# Patient Record
Sex: Female | Born: 1980 | Hispanic: No | Marital: Married
Health system: Southern US, Community
[De-identification: ages and names within clinical notes are randomized; demographics above are authoritative.]

---

## 2015-03-19 ENCOUNTER — Other Ambulatory Visit (HOSPITAL_COMMUNITY): Payer: Self-pay | Admitting: Obstetrics and Gynecology

## 2015-03-19 DIAGNOSIS — Z3A20 20 weeks gestation of pregnancy: Secondary | ICD-10-CM

## 2015-03-19 DIAGNOSIS — O283 Abnormal ultrasonic finding on antenatal screening of mother: Secondary | ICD-10-CM

## 2015-03-20 ENCOUNTER — Ambulatory Visit (HOSPITAL_COMMUNITY)
Admission: RE | Admit: 2015-03-20 | Discharge: 2015-03-20 | Disposition: A | Discharge status: Home / Self Care | Payer: Medicaid Other | Source: Ambulatory Visit

## 2015-03-20 ENCOUNTER — Encounter (HOSPITAL_COMMUNITY): Payer: Self-pay

## 2015-03-20 ENCOUNTER — Ambulatory Visit (HOSPITAL_COMMUNITY)
Admission: RE | Admit: 2015-03-20 | Discharge: 2015-03-20 | Disposition: A | Discharge status: Home / Self Care | Payer: Medicaid Other | Source: Ambulatory Visit | Attending: Obstetrics and Gynecology | Admitting: Obstetrics and Gynecology

## 2015-03-20 DIAGNOSIS — O283 Abnormal ultrasonic finding on antenatal screening of mother: Secondary | ICD-10-CM | POA: Diagnosis not present

## 2015-03-20 DIAGNOSIS — Z3A20 20 weeks gestation of pregnancy: Secondary | ICD-10-CM | POA: Diagnosis not present

## 2015-03-20 DIAGNOSIS — Z315 Encounter for genetic counseling: Secondary | ICD-10-CM | POA: Insufficient documentation

## 2015-03-23 DIAGNOSIS — O283 Abnormal ultrasonic finding on antenatal screening of mother: Secondary | ICD-10-CM | POA: Insufficient documentation

## 2015-03-23 NOTE — Progress Notes (Signed)
Genetic Counseling  High-Risk Gestation Note  Appointment Date:  03/20/2015 Referred By: Sharlynn Oliphant, DO Date of Birth:  [birth year]   Pregnancy History: [id number] Estimated Date of Delivery: 08/06/15 Estimated Gestational Age: 36w1dAttending: PBenjaman Lobe MD    I met with Ms. Kelsey Fieldingfor genetic counseling because of abnormal ultrasound findings.  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, choroid plexus cysts, single umbilical artery  Discussed increased risk for single underlying etiology such as chromosome condition or single gene condition  Patient declined NIPS and amniocentesis today, stating she needed to further discuss testing options with husband  Patient indicated she would possibly be interested in termination of pregnancy for physical abnormalities  Patient understands the legal limit for TOP in [place] is [redacted] weeks gestation but also aware of out of state options  Patient also understands that prognosis cannot be accurately determined without identifying a specific etiology and that the etiology would not likely be able to be determined solely by prenatal ultrasound  We began by reviewing the ultrasound in detail. Ultrasound performed today visualized the following: Bilateral choroid plexus cysts, Absent right radius; the right hand appears to be acutely angulated - a right thumb was not visualized, abnormal fetal heart (likely AV canal), single umbilical artery, and limited views of fetal face. Complete ultrasound results reported separately.   We discussed that each of the ultrasound findings could be isolated, separate occurrences. However, the constellation of the findings on ultrasound are more suggestive of a single underlying etiology.   Congenital heart defects occur in approximately 1% of pregnancies. Congenital heart defects may occur due to  multifactorial influences, chromosomal abnormalities, genetic syndromes or environmental exposures.  Atrioventricular septal defect (also referred to as AV canal defect or endocardial cushion defect) describes a central defect of the heart involving the atrial septum, ventricular septum, and atrioventricular valves. An AV canal defect is associated with an increased risk for fetal aneuploidy of approximately 50-60%. AV canal defect is associated with a significant increased risk for Trisomy 21, which is present in approximately 40% of cases of AV canal defect.   Radial ray malformation encompasses a spectrum of anomalies that can include aplasia or hypoplasia of the radius, radial carpal bones, and/or thumb. They can be unilateral or bilateral with variable thumb defects. Radial ray defects can be isolated or associated with additional anomalies. Radial ray malformations are associated with chromosome conditions (Trisomy 18 and Trisomy 13), single gene conditions (such as HLacinda Axonand TAR syndrome), and teratogenic exposures (maternal diabetes). The prognosis depends upon the underlying etiology.   The patient was counseled that choroid plexus cysts and single umbilical artery are not considered congenital anomalies, but would increase the likelihood for an underlying genetic or chromosome condition.  We briefly reviewed chromosome, genes, nondisjunction and examples and prognoses of various chromosome conditions including Down syndrome (trisomy 234, trisomy 123 and trisomy 175 Given the specific ultrasound findings, we spent time discussing the increased risk for trisomy 18. Additionally, congenital heart defects and radial ray malformations can be seen as part of an underlying microdeletion/microduplication syndrome, such as 13q deletion or 22q11 deletion syndrome. The particular ultrasound findings can also be associated with single gene conditions and the differential diagnoses include Holt-Oram syndrome,  Cornelia de Lange syndrome, Fanconi anemia, and associations such as VACTERL.  Congenital heart defects and radial ray defects can also be due to teratogenic exposures in  pregnancy, such as maternal diabetes. Ms. Vista Sawatzky denied teratogenic exposures in the pregnancy. We reviewed that the overall prognosis is dependent on the severity of the heart defect and the underlying etiology.  Risk for recurrence depends on etiology. We discussed with the patient that the specific etiology is not able to be determined by ultrasound alone.   We discussed the screening option of noninvasive prenatal screening (NIPS)/prenatal cell free DNA testing. NIPS would assess for certain aneuploidy conditions and select microdeletion conditions but does not diagnose or rule out these conditions. It also does not assess for all chromosome aberrations and does not assess for single gene conditions. We briefly reviewed the benefits and limitations of NIPS as a screening tool. We also discussed that diagnostic testing option of amniocentesis. We discussed the risks, benefits, and limitations, including the approximate 1 in [phone] risk for complications, including spontaneous pregnancy loss. Chromosome analysis and chromosome microarray analysis can be performed from amniocentesis.  Single gene conditions are typically tested for postnatally, based on the recommendation of a medical geneticist, unless ultrasound findings or the family history are strongly suggestive of a specific syndrome for which prenatal testing is available.    We discussed the option of serial sonography, fetal echocardiogram, and a postnatal consultation with a medical geneticist.  We discussed that an accurate discussion of prognosis is challenging, without additional information.  We also discussed the option of continuing the pregnancy versus termination of pregnancy (TOP).  We discussed that TOP is a legal option in [place] until [redacted] weeks gestation, and the  patient is understanding of this limit. We discussed that there are additional out-of-state options for termination of pregnancy.  Ms. Foutz indicated that she was considering termination of pregnancy given the possibility of additional physical appearance differences for the baby and possible additional medical issues in the case of a chromosome or genetic condition. However, she was undecided at this time. Ms. Olshefski declined pursuing NIPS or amniocentesis today. She planned to further discuss testing options with her husband as well as options for how to proceed with the pregnancy. The patient planned to contact our office early next week if she desired TOP and/or additional testing in the pregnancy.   Both family histories were reviewed and found to be noncontributory for birth defects, intellectual disability, and known genetic conditions. Without further information regarding the provided family history, an accurate genetic risk cannot be calculated. Further genetic counseling is warranted if more information is obtained.  I counseled Ms. [facility] regarding the above risks and available options.  The approximate face-to-face time with the genetic counselor was 35 minutes.  Chipper Oman, MS Certified Genetic Counselor 03/23/2015

## 2015-03-26 ENCOUNTER — Other Ambulatory Visit (HOSPITAL_COMMUNITY): Payer: Self-pay | Admitting: Obstetrics and Gynecology

## 2015-03-26 ENCOUNTER — Encounter (HOSPITAL_COMMUNITY): Payer: Self-pay | Admitting: Obstetrics and Gynecology

## 2015-04-15 ENCOUNTER — Other Ambulatory Visit (HOSPITAL_COMMUNITY): Payer: Self-pay | Admitting: Obstetrics and Gynecology

## 2015-04-15 DIAGNOSIS — Q270 Congenital absence and hypoplasia of umbilical artery: Secondary | ICD-10-CM

## 2015-04-15 DIAGNOSIS — O283 Abnormal ultrasonic finding on antenatal screening of mother: Secondary | ICD-10-CM

## 2015-04-24 ENCOUNTER — Ambulatory Visit (HOSPITAL_COMMUNITY): Payer: Medicaid Other

## 2015-05-07 ENCOUNTER — Ambulatory Visit (HOSPITAL_COMMUNITY): Payer: Medicaid Other

## 2015-05-08 ENCOUNTER — Ambulatory Visit (HOSPITAL_COMMUNITY): Payer: Medicaid Other

## 2015-05-08 ENCOUNTER — Encounter (HOSPITAL_COMMUNITY): Payer: Self-pay

## 2016-01-23 ENCOUNTER — Encounter (HOSPITAL_COMMUNITY): Payer: Self-pay

## 2017-05-22 IMAGING — US US MFM OB DETAIL+14 WK
1 series · 13 of 28 positions shown · non-contrast
Comparison: none

[Series 1: us mfm ob detail+14 wk · 91 acquisitions, 13 frames shown]
[im 4/91]
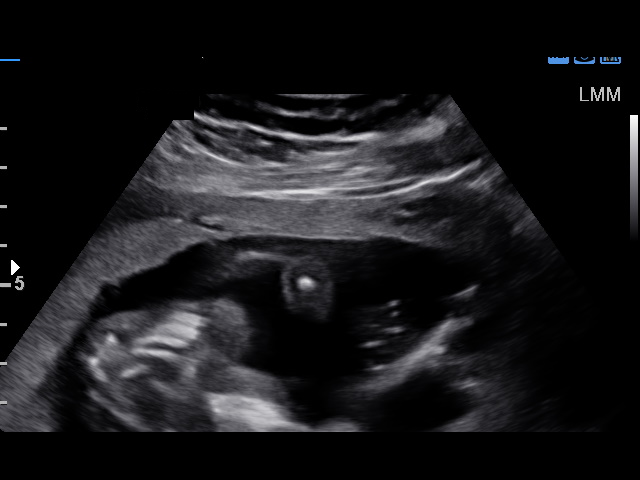
[im 11/91]
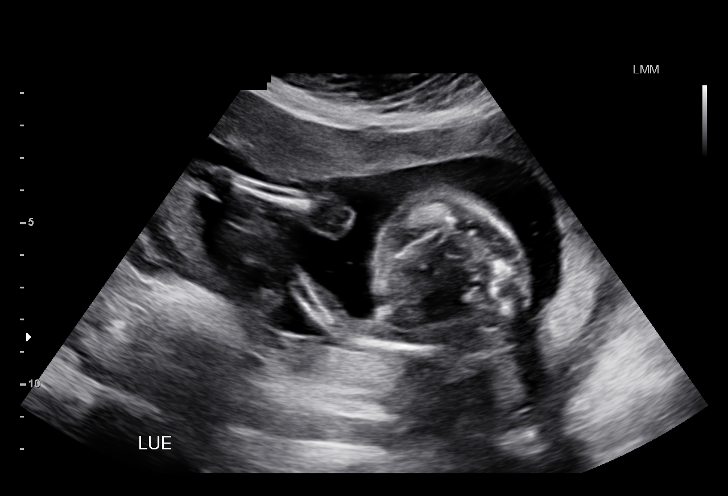
[im 17/91]
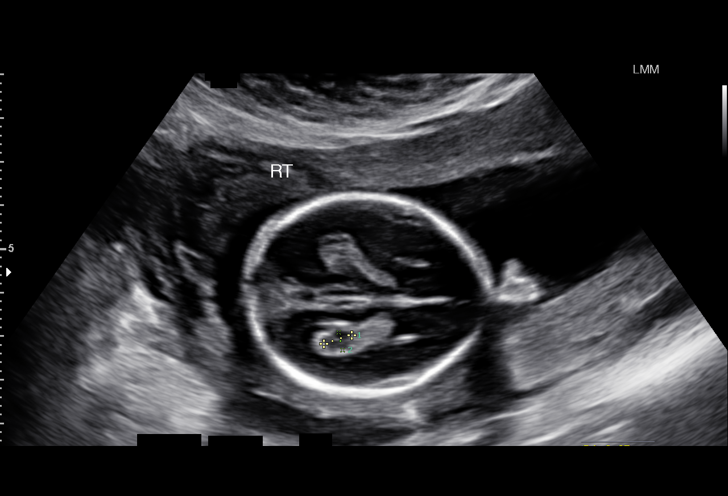
[im 24/91]
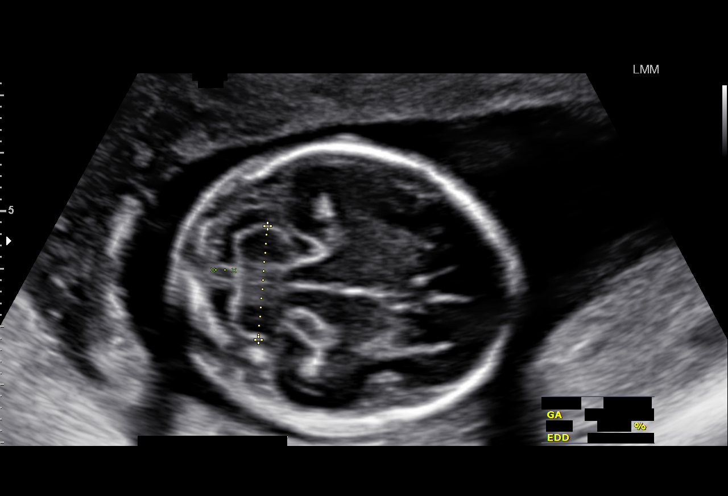
[im 31/91]
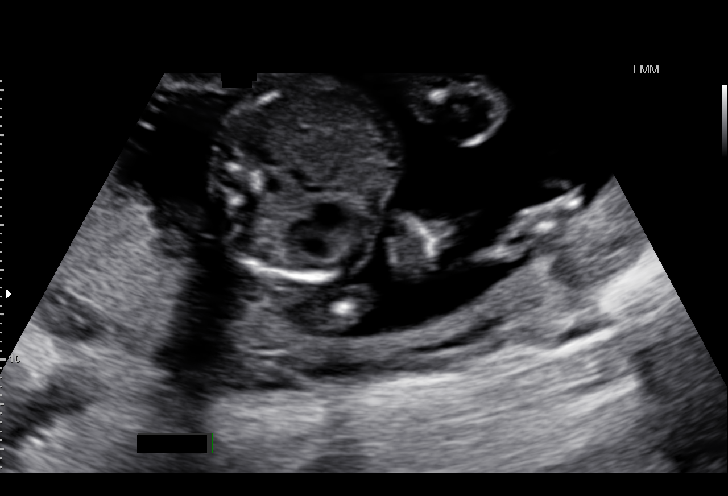
[im 37/91]
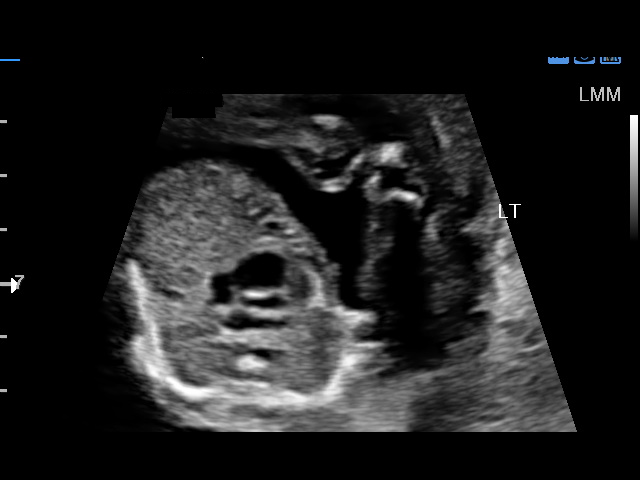
[im 47/91]
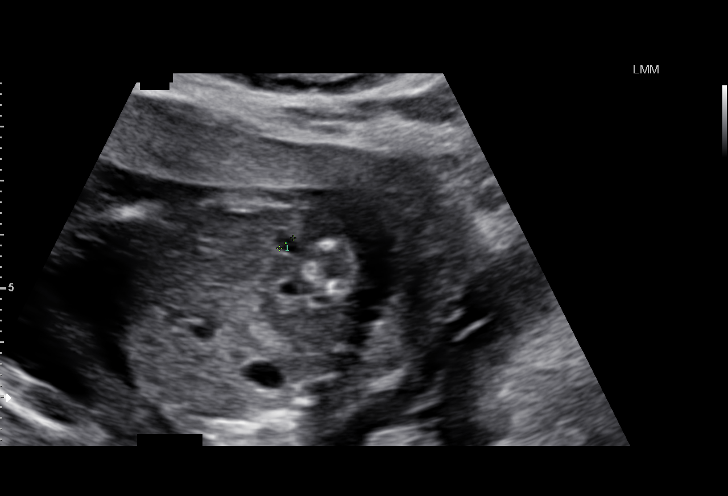
[im 54/91]
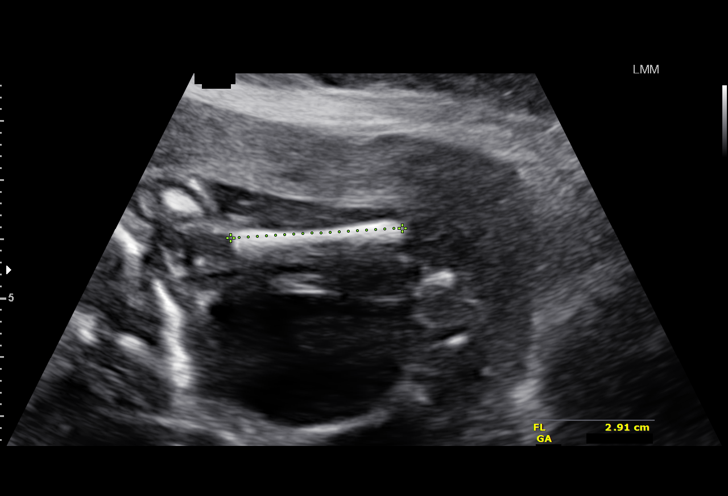
[im 61/91]
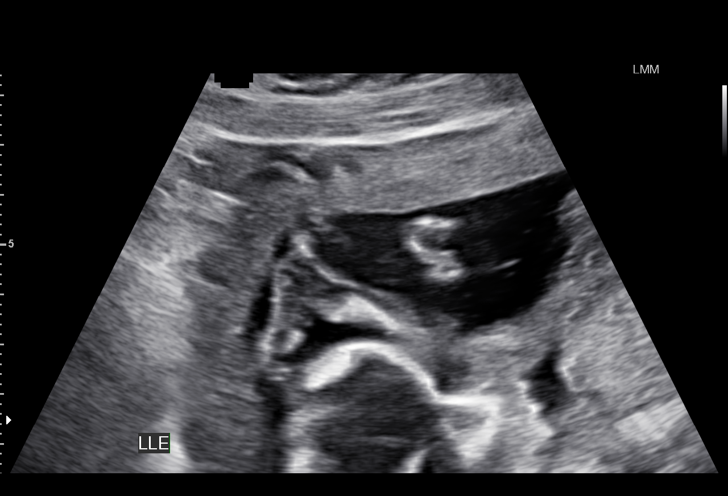
[im 67/91]
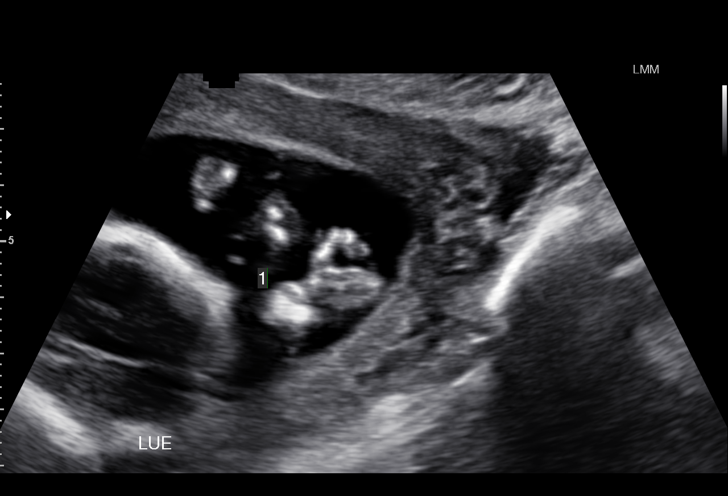
[im 74/91]
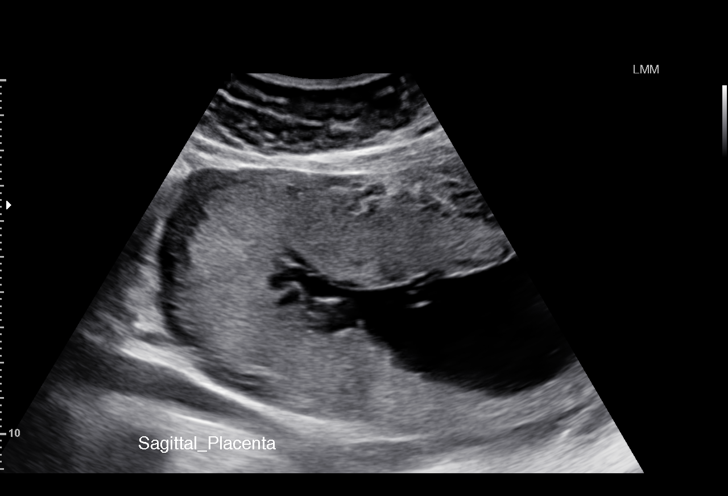
[im 81/91]
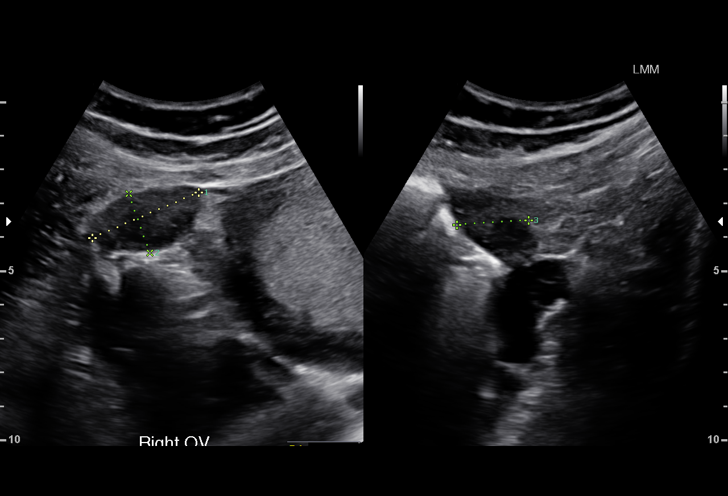
[im 87/91]
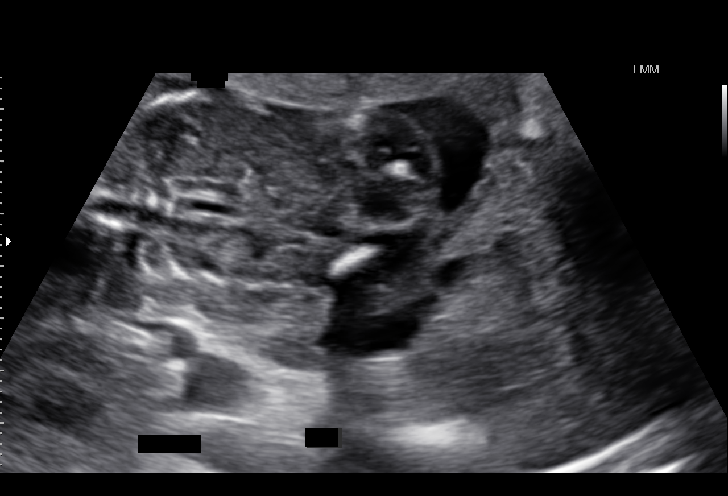

[13 of 28 positions shown; findings below may reference images not displayed]

OBSTETRICS REPORT
(Signed Final 03/20/2015 [DATE])

Name:       KAKI JIM                          Visit  03/20/2015 [DATE]
Date:

Service(s) Provided

Indications

Abnormal fetal ultrasound
20 weeks gestation of pregnancy
Fetal Evaluation

Num Of             1
Fetuses:
Fetal Heart        143                          bpm
Rate:
Cardiac Activity:  Observed
Presentation:      Variable
Placenta:          Fundal, above cervical os
P. Cord            Visualized, central
Insertion:

Amniotic Fluid
AFI FV:      Subjectively within normal limits
Larg Pckt:      4.1  cm
Biometry

BPD:     47.5   m    G. Age:   20w 2d                 CI:        80.17   70 - 86
m
FL/HC:      17.4   16.8 -
19.8
HC:     167.6   m    G. Age:   19w 3d        15  %    HC/AC:      1.13   1.09 -
m
AC:     147.7   m    G. Age:   20w 0d        42  %    FL/BPD
m                                     :
FL:      29.2   m    G. Age:   19w 0d        10  %    FL/AC:      19.8   20 - 24
m
HUM:     29.5   m    G. Age:   19w 5d        40  %
m

Est.         300   gm   0 lb 11 oz      37   %
FW:
Gestational Age

Clinical EDD:  20w 1d                                         EDD:   08/06/15
U/S Today:     19w 5d                                         EDD:   08/09/15
Best:          20w 1d    Det. By:   Clinical EDD              EDD:   08/06/15
Anatomy

Cranium:          Appears normal         Aortic Arch:       Not well visualized
Fetal Cavum:      Appears normal         Ductal Arch:       Not well visualized
Ventricles:       Appears normal         Diaphragm:         Appears normal
Choroid Plexus:   Left choroid           Stomach:           Appears normal,
plexus cyst,                              left sided
6.7mm
Cerebellum:       Appears normal         Abdomen:           Appears normal
Posterior         Appears normal         Abdominal          Not well visualized
Fossa:                                   Wall:
Nuchal Fold:      Not applicable (>20    Cord Vessels:      2 vessel cord,
wks GA)                                   absent right Deeqa Rayaan
Ikahh
Face:             Not well visualized    Kidneys:           Appear normal
Lips:             Appears normal         Bladder:           Appears normal
Heart:            Abnml 4                Spine:             Limited views
chamber, see                              appear normal
comments
RVOT:             Not well visualized    Lower              Appears normal
Extremities:
LVOT:             Not well visualized    Upper              LUE appears
Extremities:       normal; see
comments

Other:   Fetus appears to be a male. Heels appear normal. Technically
difficult due to fetal position.
Cervix Uterus Adnexa

Cervical Length:    3.8       cm

Cervix:       Normal appearance by transabdominal scan.
Uterus:       No abnormality visualized.
Cul De Sac:   No free fluid seen.

Left Ovary:    Within normal limits.
Right Ovary:   Within normal limits.
Comments

Ms. Faz was seen due to multiple fetal anomalies.  On
exam today, bilateral choroid plexus cysts were noted.
There appears to be an AV canal, although somewhat
limited views of the fetal heart were obtained.  A single
umbilical artery is appreciated.  There appears to be an
absent radius in the right upper extremity.  The right hand
is acutely agulated - a right thumb was not visualized.
Suboptimal views of the fetal face were obtained.

See separate note from Genetics counselor.  The patient is
aware that she is quickly approaching the [place] limits of
termination in the state of [place] [HOSPITAL] - was undecided
about how she would like to proceed.  She would like to
discuss further genetic testing with her husband
(amniocentesis vs. cell free fetal DNA).  In the interim, we
will make arrangements for fetal echo and follow up
ultrasound with MFM in 4 weeks.
Impression

Single IUP at 20w 1d
Bilateral choroid plexus cysts
Absent right radius; the right hand appears to be acutely
angulated - a right thumb was not visualized.
Abnormal 4 CH view - likely AV canal
Limited views of the fetal face obtained due to fetal position
The estimated fetal weight is at the 37th %tile.
Normal amniotic fluid volume.
Recommendations

Ms. Akbari Joodaki contact us the first part on next week
regarding her plans.
Follow up ultrasound in 4 weeks and fetal echo if the
patient continues the pregnancy.
